# Patient Record
Sex: Male | Born: 1990 | Race: Black or African American | Hispanic: No | Marital: Single | Smoking: Never smoker
Health system: Southern US, Community
[De-identification: ages and names within clinical notes are randomized; demographics above are authoritative.]

## PROBLEM LIST (undated history)

## (undated) DIAGNOSIS — I10 Essential (primary) hypertension: Secondary | ICD-10-CM

## (undated) DIAGNOSIS — E119 Type 2 diabetes mellitus without complications: Secondary | ICD-10-CM

---
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
## 2014-04-01 ENCOUNTER — Emergency Department (HOSPITAL_COMMUNITY)
Admission: EM | Admit: 2014-04-01 | Discharge: 2014-04-02 | Disposition: A | Discharge status: Home / Self Care | Payer: Managed Care, Other (non HMO) | Attending: Emergency Medicine | Admitting: Emergency Medicine

## 2014-04-01 ENCOUNTER — Emergency Department (HOSPITAL_COMMUNITY): Payer: Managed Care, Other (non HMO)

## 2014-04-01 ENCOUNTER — Encounter (HOSPITAL_COMMUNITY): Payer: Self-pay | Admitting: Emergency Medicine

## 2014-04-01 DIAGNOSIS — R079 Chest pain, unspecified: Secondary | ICD-10-CM | POA: Diagnosis present

## 2014-04-01 DIAGNOSIS — J069 Acute upper respiratory infection, unspecified: Secondary | ICD-10-CM | POA: Diagnosis not present

## 2014-04-01 DIAGNOSIS — B9789 Other viral agents as the cause of diseases classified elsewhere: Secondary | ICD-10-CM

## 2014-04-01 LAB — BASIC METABOLIC PANEL
Anion gap: 14 (ref 5–15)
BUN: 10 mg/dL (ref 6–23)
CHLORIDE: 100 meq/L (ref 96–112)
CO2: 24 mEq/L (ref 19–32)
CREATININE: 1.05 mg/dL (ref 0.50–1.35)
Calcium: 8.7 mg/dL (ref 8.4–10.5)
GFR calc Af Amer: >90 mL/min (ref >90)
GFR calc non Af Amer: >90 mL/min (ref >90)
GLUCOSE: 93 mg/dL (ref 70–99)
Potassium: 3.9 mEq/L (ref 3.7–5.3)
Sodium: 138 mEq/L (ref 137–147)

## 2014-04-01 LAB — CBC
HEMATOCRIT: 42.8 % (ref 39.0–52.0)
HEMOGLOBIN: 14.8 g/dL (ref 13.0–17.0)
MCH: 31 pg (ref 26.0–34.0)
MCHC: 34.6 g/dL (ref 30.0–36.0)
MCV: 89.7 fL (ref 78.0–100.0)
Platelets: 209 10*3/uL (ref 150–400)
RBC: 4.77 MIL/uL (ref 4.22–5.81)
RDW: 11.9 % (ref 11.5–15.5)
WBC: 7.7 10*3/uL (ref 4.0–10.5)

## 2014-04-01 LAB — PRO B NATRIURETIC PEPTIDE: Pro B Natriuretic peptide (BNP): 39.9 pg/mL (ref 0–125)

## 2014-04-01 LAB — I-STAT TROPONIN, ED: TROPONIN I, POC: 0 ng/mL (ref 0.00–0.08)

## 2014-04-01 MED ORDER — ACETAMINOPHEN 325 MG PO TABS
650.0000 mg | ORAL_TABLET | Freq: Once | ORAL | Status: AC
  Administered 2014-04-01: 650 mg via ORAL
  Filled 2014-04-01: qty 2

## 2014-04-01 MED ORDER — IBUPROFEN 800 MG PO TABS
800.0000 mg | ORAL_TABLET | Freq: Once | ORAL | Status: AC
  Administered 2014-04-02: 800 mg via ORAL
  Filled 2014-04-01: qty 1

## 2014-04-01 NOTE — ED Notes (Signed)
Patient states he started with a cough 2 days ago and was just feeling bad.  States not hungry but has been taking fluids  Upon entering the room patient is sweating.  Denies any pain at this time

## 2014-04-01 NOTE — ED Provider Notes (Signed)
CSN: [id number]     Arrival date & time 04/01/14  2200 History   First MD Initiated Contact with Patient 04/01/14 2312     Chief Complaint  Patient presents with  . Chest Pain     (Consider location/radiation/quality/duration/timing/severity/associated sxs/prior Treatment) HPI  Tony Oneill is a 23 y.o. male with no significant past medical history coming in with chest pain. Patient states she's had viral URI like symptoms for the past 2 days. He describes this as rhinorrhea, subjective fevers, dry cough. He has had sick contacts and his friends with similar symptoms. He presents today because he had sudden onset of chest pain and dizziness. Pain is described as midsternal, nonradiating, and lasted approximately 30 minutes before self resolving in the waiting room. He is described as a throbbing sensation, nonexertional. He describes no shortness of breath, vomiting or diaphoresis with the pain.  Patient had normal appetite, he denies changes in his bowel or bladder. Patient is currently asymptomatic.  10 Systems reviewed and are negative for acute change except as noted in the HPI.    History reviewed. No pertinent past medical history. History reviewed. No pertinent past surgical history. No family history on file. History  Substance Use Topics  . Smoking status: Never Smoker   . Smokeless tobacco: Not on file  . Alcohol Use: No    Review of Systems    Allergies  Review of patient's allergies indicates no known allergies.  Home Medications   Prior to Admission medications   Medication Sig Start Date End Date Taking? Authorizing Provider  Acetaminophen-Guaifenesin (TYLENOL CHEST CONGESTION PO) Take 10 mLs by mouth daily as needed (cold).   Yes Historical Provider, MD  guaiFENesin (ROBITUSSIN) 100 MG/5ML liquid Take 200 mg by mouth 3 (three) times daily as needed for cough.   Yes Historical Provider, MD   BP 145/76  Pulse 92  Temp(Src) 100.4 F (38 C) (Oral)  Resp 21   Ht  (1.956 m)  Wt 310 lb (140.615 kg)  BMI 36.75 kg/m2  SpO2 97% Physical Exam  Nursing note and vitals reviewed. Constitutional: He is oriented to person, place, and time. Vital signs are normal. He appears well-developed and well-nourished.  Non-toxic appearance. He does not appear ill. No distress.  HENT:  Head: Normocephalic and atraumatic.  Nose: Nose normal.  Mouth/Throat: Oropharynx is clear and moist. No oropharyngeal exudate.  Eyes: Conjunctivae and EOM are normal. Pupils are equal, round, and reactive to light. No scleral icterus.  Neck: Normal range of motion. Neck supple. No tracheal deviation, no edema, no erythema and normal range of motion present. No mass and no thyromegaly present.  Cardiovascular: Normal rate, regular rhythm, S1 normal, S2 normal, normal heart sounds, intact distal pulses and normal pulses.  Exam reveals no gallop and no friction rub.   No murmur heard. Pulses:      Radial pulses are 2+ on the right side, and 2+ on the left side.       Dorsalis pedis pulses are 2+ on the right side, and 2+ on the left side.  No tachycardia my exam.  Pulmonary/Chest: Effort normal and breath sounds normal. No respiratory distress. He has no wheezes. He has no rhonchi. He has no rales.  Abdominal: Soft. Normal appearance and bowel sounds are normal. He exhibits no distension, no ascites and no mass. There is no hepatosplenomegaly. There is no tenderness. There is no rebound, no guarding and no CVA tenderness.  Musculoskeletal: Normal range of motion. He  exhibits no edema and no tenderness.  Lymphadenopathy:    He has no cervical adenopathy.  Neurological: He is alert and oriented to person, place, and time. He has normal strength. No cranial nerve deficit or sensory deficit. GCS eye subscore is 4. GCS verbal subscore is 5. GCS motor subscore is 6.  Skin: Skin is warm and intact. No petechiae and no rash noted. He is diaphoretic. No erythema. No pallor.  Psychiatric: He  has a normal mood and affect. His behavior is normal. Judgment normal.    ED Course  Procedures (including critical care time) Labs Review Labs Reviewed  CBC  BASIC METABOLIC PANEL  PRO B NATRIURETIC PEPTIDE  I-STAT TROPOININ, ED    Imaging Review Dg Chest 2 View  04/01/2014   CLINICAL DATA:  Chest pain  EXAM: CHEST  2 VIEW  COMPARISON:  None.  FINDINGS: Lungs are clear.  No pleural effusion or pneumothorax.  The heart is normal in size.  Visualized osseous structures are within normal limits.  IMPRESSION: Normal chest radiographs.   Electronically Signed   By: Charline Bills M.D.   On: 04/01/2014 23:03     EKG Interpretation   Date/Time:  Monday April 01 2014 22:03:41 EDT Ventricular Rate:  115 PR Interval:  150 QRS Duration: 74 QT Interval:  306 QTC Calculation: 423 R Axis:   73 Text Interpretation:  Sinus tachycardia T wave abnormality, consider  inferolateral ischemia Abnormal ECG Confirmed by Erroll Luna  [phone]) on 04/01/2014 11:11:46 PM      MDM   Final diagnoses:  None    Patient is to emergency department out of concern for chest pain. He states he took Robitussin prior to arrival. He is given Tylenol triaged which decreased his temperature from 102.3 down to 100.4. Patient's heart rate was in the room was 92, down from 1:15. His vital signs abnormalities are likely due to his fever. Laboratory studies are unremarkable, chest x-ray did not show a consolidation, My lung exam is clear bilaterally. I do not believe this patient has a pneumonia. This is likely a viral URI. He was given additional Motrin in the emergency department to further treat his fever. He is advised to continue taking Tylenol or Motrin at home for fever relief and return for any worsening. Patient is advised to followup as primary physician within 3 days.  Repeat EKG reveal tachycardia resolved.  TWI persist in v4-v6.  May be persistent juvenile t wave inversion pattern.  Suspicion  for ACS is low in setting of viral URI.  Non-exertional chest pain, without radiation, which resolved on its own in 30 min.  Tomasita Crumble, MD 04/02/14 [phone]

## 2014-04-01 NOTE — ED Notes (Signed)
Pt. reports midsternal chest pain onset this evening with SOB and occasional dry cough , denies nausea or diaphoresis . Febrile at triage .

## 2014-04-02 NOTE — Discharge Instructions (Signed)
Upper Respiratory Infection, Adult Tony Oneill, you were seen today for fever, cough, and runny nose.  Your chest xray did not show pneumonia and your lungs sound clear.  Please follow up with your regular doctor within 3 days for continued care.  Continue to take tylenol or motrin at home for fever.  If your symptoms worsen, or you start coughing stuff up, having persistent fevers and worsening shortness of breath, return to the ED immediately for repeat evaluation.  Thank you. An upper respiratory infection (URI) is also known as the common cold. It is often caused by a type of germ (virus). Colds are easily spread (contagious). You can pass it to others by kissing, coughing, sneezing, or drinking out of the same glass. Usually, you get better in 1 or 2 weeks.  HOME CARE   Only take medicine as told by your doctor.  Use a warm mist humidifier or breathe in steam from a hot shower.  Drink enough water and fluids to keep your pee (urine) clear or pale yellow.  Get plenty of rest.  Return to work when your temperature is back to normal or as told by your doctor. You may use a face mask and wash your hands to stop your cold from spreading. GET HELP RIGHT AWAY IF:   After the first few days, you feel you are getting worse.  You have questions about your medicine.  You have chills, shortness of breath, or brown or red spit (mucus).  You have yellow or brown snot (nasal discharge) or pain in the face, especially when you bend forward.  You have a fever, puffy (swollen) neck, pain when you swallow, or white spots in the back of your throat.  You have a bad headache, ear pain, sinus pain, or chest pain.  You have a high-pitched whistling sound when you breathe in and out (wheezing).  You have a lasting cough or cough up blood.  You have sore muscles or a stiff neck. MAKE SURE YOU:   Understand these instructions.  Will watch your condition.  Will get help right away if you are not doing  well or get worse. Document Released: 12/29/2007 Document Revised: 10/04/2011 Document Reviewed: 10/17/2013 [facility] Patient Information 2015 [place], [place]. This information is not intended to replace advice given to you by your health care provider. Make sure you discuss any questions you have with your health care provider.

## 2014-12-30 ENCOUNTER — Encounter (HOSPITAL_COMMUNITY): Payer: Self-pay | Admitting: Emergency Medicine

## 2014-12-30 ENCOUNTER — Emergency Department (HOSPITAL_COMMUNITY)
Admission: EM | Admit: 2014-12-30 | Discharge: 2014-12-30 | Disposition: A | Discharge status: Home / Self Care | Payer: Managed Care, Other (non HMO) | Attending: Emergency Medicine | Admitting: Emergency Medicine

## 2014-12-30 DIAGNOSIS — R1084 Generalized abdominal pain: Secondary | ICD-10-CM

## 2014-12-30 DIAGNOSIS — R197 Diarrhea, unspecified: Secondary | ICD-10-CM

## 2014-12-30 DIAGNOSIS — R509 Fever, unspecified: Secondary | ICD-10-CM | POA: Insufficient documentation

## 2014-12-30 DIAGNOSIS — E860 Dehydration: Secondary | ICD-10-CM | POA: Insufficient documentation

## 2014-12-30 DIAGNOSIS — R630 Anorexia: Secondary | ICD-10-CM | POA: Insufficient documentation

## 2014-12-30 DIAGNOSIS — R000 Tachycardia, unspecified: Secondary | ICD-10-CM | POA: Insufficient documentation

## 2014-12-30 DIAGNOSIS — R110 Nausea: Secondary | ICD-10-CM

## 2014-12-30 DIAGNOSIS — E669 Obesity, unspecified: Secondary | ICD-10-CM | POA: Insufficient documentation

## 2014-12-30 LAB — URINALYSIS, ROUTINE W REFLEX MICROSCOPIC
Bilirubin Urine: NEGATIVE
GLUCOSE, UA: NEGATIVE mg/dL
HGB URINE DIPSTICK: NEGATIVE
Ketones, ur: 15 mg/dL — AB
Nitrite: NEGATIVE
Protein, ur: 30 mg/dL — AB
SPECIFIC GRAVITY, URINE: 1.035 — AB (ref 1.005–1.030)
UROBILINOGEN UA: 1 mg/dL (ref 0.0–1.0)
pH: 5.5 (ref 5.0–8.0)

## 2014-12-30 LAB — CBC WITH DIFFERENTIAL/PLATELET
Basophils Absolute: 0 10*3/uL (ref 0.0–0.1)
Basophils Relative: 0 % (ref 0–1)
EOS PCT: 0 % (ref 0–5)
Eosinophils Absolute: 0 10*3/uL (ref 0.0–0.7)
HCT: 41.6 % (ref 39.0–52.0)
HEMOGLOBIN: 13.9 g/dL (ref 13.0–17.0)
LYMPHS ABS: 1.2 10*3/uL (ref 0.7–4.0)
LYMPHS PCT: 13 % (ref 12–46)
MCH: 30.3 pg (ref 26.0–34.0)
MCHC: 33.4 g/dL (ref 30.0–36.0)
MCV: 90.6 fL (ref 78.0–100.0)
MONOS PCT: 15 % — AB (ref 3–12)
Monocytes Absolute: 1.4 10*3/uL — ABNORMAL HIGH (ref 0.1–1.0)
Neutro Abs: 6.9 10*3/uL (ref 1.7–7.7)
Neutrophils Relative %: 72 % (ref 43–77)
Platelets: 153 10*3/uL (ref 150–400)
RBC: 4.59 MIL/uL (ref 4.22–5.81)
RDW: 12.2 % (ref 11.5–15.5)
WBC: 9.6 10*3/uL (ref 4.0–10.5)

## 2014-12-30 LAB — COMPREHENSIVE METABOLIC PANEL
ALT: 33 U/L (ref 17–63)
AST: 36 U/L (ref 15–41)
Albumin: 3.5 g/dL (ref 3.5–5.0)
Alkaline Phosphatase: 49 U/L (ref 38–126)
Anion gap: 10 (ref 5–15)
BUN: 13 mg/dL (ref 6–20)
CO2: 26 mmol/L (ref 22–32)
Calcium: 8.6 mg/dL — ABNORMAL LOW (ref 8.9–10.3)
Chloride: 101 mmol/L (ref 101–111)
Creatinine, Ser: 1.28 mg/dL — ABNORMAL HIGH (ref 0.61–1.24)
GFR calc Af Amer: >60 mL/min (ref >60)
GFR calc non Af Amer: >60 mL/min (ref >60)
Glucose, Bld: 88 mg/dL (ref 65–99)
Potassium: 3.7 mmol/L (ref 3.5–5.1)
Sodium: 137 mmol/L (ref 135–145)
Total Bilirubin: 0.9 mg/dL (ref 0.3–1.2)
Total Protein: 8.2 g/dL — ABNORMAL HIGH (ref 6.5–8.1)

## 2014-12-30 LAB — URINE MICROSCOPIC-ADD ON

## 2014-12-30 LAB — I-STAT CHEM 8, ED
BUN: 12 mg/dL (ref 6–20)
CALCIUM ION: 1.13 mmol/L (ref 1.12–1.23)
CHLORIDE: 100 mmol/L — AB (ref 101–111)
CREATININE: 1.3 mg/dL — AB (ref 0.61–1.24)
Glucose, Bld: 105 mg/dL — ABNORMAL HIGH (ref 65–99)
HCT: 45 % (ref 39.0–52.0)
HEMOGLOBIN: 15.3 g/dL (ref 13.0–17.0)
POTASSIUM: 3.8 mmol/L (ref 3.5–5.1)
SODIUM: 138 mmol/L (ref 135–145)
TCO2: 23 mmol/L (ref 0–100)

## 2014-12-30 MED ORDER — PROMETHAZINE HCL 25 MG PO TABS: 25.0000 mg | ORAL_TABLET | Freq: Four times a day (QID) | ORAL | Status: AC | PRN

## 2014-12-30 MED ORDER — SODIUM CHLORIDE 0.9 % IV BOLUS (SEPSIS)
1000.0000 mL | Freq: Once | INTRAVENOUS | Status: AC
  Administered 2014-12-30: 1000 mL via INTRAVENOUS

## 2014-12-30 MED ORDER — KETOROLAC TROMETHAMINE 30 MG/ML IJ SOLN
30.0000 mg | Freq: Once | INTRAMUSCULAR | Status: AC
  Administered 2014-12-30: 30 mg via INTRAVENOUS
  Filled 2014-12-30: qty 1

## 2014-12-30 MED ORDER — ONDANSETRON HCL 4 MG/2ML IJ SOLN
4.0000 mg | Freq: Once | INTRAMUSCULAR | Status: AC
  Administered 2014-12-30: 4 mg via INTRAVENOUS
  Filled 2014-12-30: qty 2

## 2014-12-30 MED ORDER — LOPERAMIDE HCL 2 MG PO CAPS
4.0000 mg | ORAL_CAPSULE | Freq: Once | ORAL | Status: AC
  Administered 2014-12-30: 4 mg via ORAL
  Filled 2014-12-30: qty 2

## 2014-12-30 MED ORDER — ACETAMINOPHEN 325 MG PO TABS
650.0000 mg | ORAL_TABLET | Freq: Once | ORAL | Status: AC
  Administered 2014-12-30: 650 mg via ORAL
  Filled 2014-12-30: qty 2

## 2014-12-30 NOTE — ED Notes (Signed)
Ambulated pt down hallway. Oxygen saturation remained 100% HR-90-95 resp 21

## 2014-12-30 NOTE — ED Notes (Signed)
Pt c/o abd pain with nausea, diarrhea and elevated temp x's 3 days

## 2014-12-30 NOTE — ED Notes (Signed)
Pt is aware urine is needed for testing, urinal at bedside. 

## 2014-12-30 NOTE — ED Provider Notes (Signed)
CSN: [id number]     Arrival date & time 12/30/14  1659 History   First MD Initiated Contact with Patient 12/30/14 2009     Chief Complaint  Patient presents with  . Abdominal Pain     (Consider location/radiation/quality/duration/timing/severity/associated sxs/prior Treatment) HPI Pt is a 24yo obese male, presenting to ED with c/o generalized cramping abdominal pain with associated nausea and watery diarrhea that started yesterday after he ate come [employer].  Pt states he thinks that is what caused his symptoms. Triage note states pt has had symptoms for 3 days but pt states it started yesterday. Pt also reports subjective fever with hot and cold chills.  Reports nausea but no vomiting. He has had about 20 episodes of watery diarrhea today w/o blood or mucous. Abdominal pain is cramping, 7/10 at worst. Denies back pain, denies pain with urination or decreased urine but does report darkened urine.  No sick contacts or recent travel. No recent use of antibiotics. Denies hx of abdominal surgeries. No other significant PMH.    History reviewed. No pertinent past medical history. History reviewed. No pertinent past surgical history. No family history on file. History  Substance Use Topics  . Smoking status: Never Smoker   . Smokeless tobacco: Not on file  . Alcohol Use: No    Review of Systems  Constitutional: Positive for fever and appetite change. Negative for chills, diaphoresis and fatigue.  Respiratory: Negative for cough and shortness of breath.   Cardiovascular: Negative for chest pain, palpitations and leg swelling.  Gastrointestinal: Positive for nausea, abdominal pain ( generalized, cramping) and diarrhea. Negative for vomiting, constipation, blood in stool and rectal pain.  Genitourinary: Positive for hematuria. Negative for dysuria, urgency, frequency, decreased urine volume, discharge, penile swelling, scrotal swelling, penile pain and testicular pain.  Musculoskeletal: Negative for  myalgias and back pain.  Skin: Negative for rash.  All other systems reviewed and are negative.     Allergies  Review of patient's allergies indicates no known allergies.  Home Medications   Prior to Admission medications   Not on File   BP 116/55 mmHg  Pulse 81  Temp(Src) 101 F (38.3 C) (Oral)  Resp 19  SpO2 97% Physical Exam  Constitutional: He appears well-developed and well-nourished.  Obese male lying in exam bed, NAD  HENT:  Head: Normocephalic and atraumatic.  Mouth/Throat: Oropharynx is clear and moist.  Eyes: Conjunctivae are normal. No scleral icterus.  Neck: Normal range of motion. Neck supple.  Cardiovascular: Regular rhythm and normal heart sounds.  Tachycardia present.   Pulmonary/Chest: Effort normal and breath sounds normal. No respiratory distress. He has no wheezes. He has no rales. He exhibits no tenderness.  Abdominal: Soft. Bowel sounds are normal. He exhibits no distension and no mass. There is no tenderness. There is no rebound and no guarding.  Obese abdomen, soft, non-tender.  Musculoskeletal: Normal range of motion.  Neurological: He is alert.  Skin: Skin is warm and dry.  Nursing note and vitals reviewed.   ED Course  Procedures (including critical care time) Labs Review Labs Reviewed  CBC WITH DIFFERENTIAL/PLATELET - Abnormal; Notable for the following:    Monocytes Relative 15 (*)    Monocytes Absolute 1.4 (*)    All other components within normal limits  URINALYSIS, ROUTINE W REFLEX MICROSCOPIC (NOT AT [facility]) - Abnormal; Notable for the following:    Color, Urine ORANGE (*)    Specific Gravity, Urine 1.035 (*)    Ketones, ur 15 (*)  Protein, ur 30 (*)    Leukocytes, UA TRACE (*)    All other components within normal limits  COMPREHENSIVE METABOLIC PANEL - Abnormal; Notable for the following:    Creatinine, Ser 1.28 (*)    Calcium 8.6 (*)    Total Protein 8.2 (*)    All other components within normal limits  URINE  MICROSCOPIC-ADD ON - Abnormal; Notable for the following:    Casts HYALINE CASTS (*)    All other components within normal limits  I-STAT CHEM 8, ED - Abnormal; Notable for the following:    Chloride 100 (*)    Creatinine, Ser 1.30 (*)    Glucose, Bld 105 (*)    All other components within normal limits  STOOL CULTURE    Imaging Review No results found.   EKG Interpretation None      MDM   Final diagnoses:  None   Pt is a 24yo male presenting to ED with c/o 2 day hx of nausea with diarrhea and generalized abdominal pain. No sick contacts, recent travel, or recent antibiotic use.  Pt is obese, abdomen is soft, non-tender. Moist mucous membranes.  Vitals: pt tachycardic, temp of 103. Pt given acetaminophen in triage. Labs c/w mild dehydration with slight elevated Cr at 1.3 and UA positive for ketones and protein.   Doubt acute surgical abdomen, no tenderness on exam. No leukocytosis.  Symptoms likely viral in nature or related to [facility] pt ate yesterday prior to symptom onset.  Order for stool culture placed. Pt given 2L IV fluids, toradol, imodium, and zofran.  Pt states he is feeling better after IV fluids, pain resolved.  Vitals had improved, however, at discharge, Resp rate went up to 28.  Discussed pt with Dr. Fredderick Phenix, will ambulate pt in halls.   Per Donneta Romberg, RN, pt did well ambulating with O2 Sat of 100% on RA, HR:90-95bpm, resp 21.  Pt safe for discharge home. Home care instructions and work note provided. Rx: phenergan. Advised to f/u with PCP at [facility] in 2-3 days for symptom recheck if not improving. Return precautions provided. Pt and family verbalized understanding and agreement with tx plan.   Junius Finner, PA-C 12/30/14 [id number]  Rolan Bucco, MD 12/30/14 [phone]

## 2014-12-30 NOTE — ED Notes (Signed)
PA AT BEDSIDE  

## 2015-01-07 DIAGNOSIS — R531 Weakness: Secondary | ICD-10-CM | POA: Insufficient documentation

## 2015-01-07 NOTE — ED Notes (Signed)
Pt in c/o continued dizziness since he was sick last week, states he thought he had food poisoning, other symptoms have resolved but patient still feels dizzy at times if he changes position, seen here for same already, no distress noted

## 2015-01-08 ENCOUNTER — Emergency Department (HOSPITAL_COMMUNITY): Payer: Managed Care, Other (non HMO)

## 2015-01-08 ENCOUNTER — Emergency Department (HOSPITAL_COMMUNITY)
Admission: EM | Admit: 2015-01-08 | Discharge: 2015-01-08 | Disposition: A | Discharge status: Home / Self Care | Payer: Self-pay | Attending: Emergency Medicine | Admitting: Emergency Medicine

## 2015-01-08 DIAGNOSIS — R531 Weakness: Secondary | ICD-10-CM

## 2015-01-08 LAB — HEPATIC FUNCTION PANEL
ALBUMIN: 3.7 g/dL (ref 3.5–5.0)
ALT: 84 U/L — ABNORMAL HIGH (ref 17–63)
AST: 52 U/L — ABNORMAL HIGH (ref 15–41)
Alkaline Phosphatase: 48 U/L (ref 38–126)
Bilirubin, Direct: 0.2 mg/dL (ref 0.1–0.5)
Indirect Bilirubin: 0.3 mg/dL (ref 0.3–0.9)
Total Bilirubin: 0.5 mg/dL (ref 0.3–1.2)
Total Protein: 9.3 g/dL — ABNORMAL HIGH (ref 6.5–8.1)

## 2015-01-08 LAB — CBC
HCT: 41.4 % (ref 39.0–52.0)
Hemoglobin: 13.9 g/dL (ref 13.0–17.0)
MCH: 30 pg (ref 26.0–34.0)
MCHC: 33.6 g/dL (ref 30.0–36.0)
MCV: 89.2 fL (ref 78.0–100.0)
PLATELETS: 389 10*3/uL (ref 150–400)
RBC: 4.64 MIL/uL (ref 4.22–5.81)
RDW: 11.7 % (ref 11.5–15.5)
WBC: 7.8 10*3/uL (ref 4.0–10.5)

## 2015-01-08 LAB — BASIC METABOLIC PANEL
Anion gap: 10 (ref 5–15)
BUN: 9 mg/dL (ref 6–20)
CO2: 24 mmol/L (ref 22–32)
Calcium: 9.6 mg/dL (ref 8.9–10.3)
Chloride: 108 mmol/L (ref 101–111)
Creatinine, Ser: 1.05 mg/dL (ref 0.61–1.24)
GFR calc Af Amer: >60 mL/min (ref >60)
Glucose, Bld: 91 mg/dL (ref 65–99)
Potassium: 3.8 mmol/L (ref 3.5–5.1)
SODIUM: 142 mmol/L (ref 135–145)

## 2015-01-08 LAB — I-STAT TROPONIN, ED: Troponin i, poc: 0 ng/mL (ref 0.00–0.08)

## 2015-01-08 MED ORDER — SODIUM CHLORIDE 0.9 % IV BOLUS (SEPSIS)
1000.0000 mL | Freq: Once | INTRAVENOUS | Status: AC
  Administered 2015-01-08: 1000 mL via INTRAVENOUS

## 2015-01-08 NOTE — ED Notes (Signed)
Pt stable, ambulatory, states understanding of discharge instructions 

## 2015-01-08 NOTE — ED Provider Notes (Signed)
CSN: [id number]     Arrival date & time 01/07/15  2320 History  This chart was scribed for Tomasita Crumble, MD by Bronson Curb, ED Scribe. This patient was seen in room B17C/B17C and the patient's care was started at 3:41 AM.   Chief Complaint  Patient presents with  . Dizziness    The history is provided by the patient. No language interpreter was used.     HPI Comments: Tony Oneill is a 24 y.o. male who presents to the Emergency Department complaining of gradual onset, intermittent, dizziness for the past 9 days. Patient was seen here 9 days ago for abdominal pain and diarrhea that believed was related to food poisoning. However, he reports his symptoms have since resolved, but still feels dizzy at this time. He is unsure of what triggers his dizziness and states episodes occur at random. There is associated generalized weakness/fatigue, nausea, and decreased appetite. He denies any other symptoms at this time.    No past medical history on file. No past surgical history on file. No family history on file. History  Substance Use Topics  . Smoking status: Never Smoker   . Smokeless tobacco: Not on file  . Alcohol Use: No    Review of Systems  A complete 10 system review of systems was obtained and all systems are negative except as noted in the HPI and PMH.   Allergies  Review of patient's allergies indicates no known allergies.  Home Medications   Prior to Admission medications   Medication Sig Start Date End Date Taking? Authorizing Provider  acetaminophen (TYLENOL) 325 MG tablet Take 650 mg by mouth every 6 (six) hours as needed for mild pain.   Yes Historical Provider, MD  promethazine (PHENERGAN) 25 MG tablet Take 1 tablet (25 mg total) by mouth every 6 (six) hours as needed for nausea or vomiting. Patient not taking: Reported on 01/08/2015 12/30/14   Junius Finner, PA-C   Triage Vitals: BP 144/90 mmHg  Pulse 78  Temp(Src) 99.3 F (37.4 C)  Resp 20  Wt 295 lb  (133.811 kg)  SpO2 98%  Physical Exam  Constitutional: He is oriented to person, place, and time. Vital signs are normal. He appears well-developed and well-nourished.  Non-toxic appearance. He does not appear ill. No distress.  HENT:  Head: Normocephalic and atraumatic.  Nose: Nose normal.  Mouth/Throat: Oropharynx is clear and moist. No oropharyngeal exudate.  Eyes: Conjunctivae and EOM are normal. Pupils are equal, round, and reactive to light. No scleral icterus.  Neck: Normal range of motion. Neck supple. No tracheal deviation, no edema, no erythema and normal range of motion present. No thyroid mass and no thyromegaly present.  Cardiovascular: Normal rate, regular rhythm, S1 normal, S2 normal, normal heart sounds, intact distal pulses and normal pulses.  Exam reveals no gallop and no friction rub.   No murmur heard. Pulses:      Radial pulses are 2+ on the right side, and 2+ on the left side.       Dorsalis pedis pulses are 2+ on the right side, and 2+ on the left side.  Pulmonary/Chest: Effort normal and breath sounds normal. No respiratory distress. He has no wheezes. He has no rhonchi. He has no rales.  Abdominal: Soft. Normal appearance and bowel sounds are normal. He exhibits no distension, no ascites and no mass. There is no hepatosplenomegaly. There is no tenderness. There is no rebound, no guarding and no CVA tenderness.  Musculoskeletal: Normal range of  motion. He exhibits no edema or tenderness.  Lymphadenopathy:    He has no cervical adenopathy.  Neurological: He is alert and oriented to person, place, and time. He has normal strength. No cranial nerve deficit or sensory deficit.  Skin: Skin is warm, dry and intact. No petechiae and no rash noted. He is not diaphoretic. No erythema. No pallor.  Psychiatric: He has a normal mood and affect. His behavior is normal. Judgment normal.  Nursing note and vitals reviewed.   ED Course  Procedures (including critical care  time)  DIAGNOSTIC STUDIES: Oxygen Saturation is 98% on room air, normal by my interpretation.    COORDINATION OF CARE: At 78 Discussed treatment plan with patient which includes CXR. Patient agrees.   Labs Review Labs Reviewed  HEPATIC FUNCTION PANEL - Abnormal; Notable for the following:    Total Protein 9.3 (*)    AST 52 (*)    ALT 84 (*)    All other components within normal limits  CBC  BASIC METABOLIC PANEL  Rosezena Sensor, ED    Imaging Review Dg Chest 2 View  01/08/2015   CLINICAL DATA:  Dizziness.  Food poisoning over the weekend.  EXAM: CHEST  2 VIEW  COMPARISON:  04/01/2014  FINDINGS: The heart size and mediastinal contours are within normal limits. Both lungs are clear. The visualized skeletal structures are unremarkable.  IMPRESSION: No active cardiopulmonary disease.   Electronically Signed   By: Burman Nieves M.D.   On: 01/08/2015 04:17     EKG Interpretation   Date/Time:  Wednesday January 08 2015 04:00:45 EDT Ventricular Rate:  72 PR Interval:  181 QRS Duration: 80 QT Interval:  392 QTC Calculation: 429 R Axis:   53 Text Interpretation:  Sinus rhythm new TWI in V3 Confirmed by Erroll Luna [id number]) on 01/08/2015 4:04:58 AM      MDM   Final diagnoses:  None  Patient presents to the emergency department for dizziness and weakness. He was evaluated last week and thought he had food poisoning but his dizziness persists.  His dizziness is intermittent and gradual in onset. He states he's had a poor appetite. He feels dehydrated. I give the patient 1 L fluid bolus and he states "I feel a lot better". EKG does show single new T wave in V3, troponin is negative. His history is not consistent with a cardiac etiology. He appears well in no acute distress. His vital signs were within his normal limits and is safe for discharge with close primary care follow-up within 3 days.   I personally performed the services described in this documentation, which  was scribed in my presence. The recorded information has been reviewed and is accurate.   Tomasita Crumble, MD 01/08/15 [phone]

## 2015-01-08 NOTE — Discharge Instructions (Signed)
Dizziness Tony Oneill, see a primary care physician within 3 days for close follow-up. If any symptoms worsen come back to the emergency department immediately. Thank you.  Dizziness means you feel unsteady or lightheaded. You might feel like you are going to pass out (faint). HOME CARE   Drink enough fluids to keep your pee (urine) clear or pale yellow.  Take your medicines exactly as told by your doctor. If you take blood pressure medicine, always stand up slowly from the lying or sitting position. Hold on to something to steady yourself.  If you need to stand in one place for a long time, move your legs often. Tighten and relax your leg muscles.  Have someone stay with you until you feel okay.  Do not drive or use heavy machinery if you feel dizzy.  Do not drink alcohol. GET HELP RIGHT AWAY IF:   You feel dizzy or lightheaded and it gets worse.  You feel sick to your stomach (nauseous), or you throw up (vomit).  You have trouble talking or walking.  You feel weak or have trouble using your arms, hands, or legs.  You cannot think clearly or have trouble forming sentences.  You have chest pain, belly (abdominal) pain, sweating, or you are short of breath.  Your vision changes.  You are bleeding.  You have problems from your medicine that seem to be getting worse. MAKE SURE YOU:   Understand these instructions.  Will watch your condition.  Will get help right away if you are not doing well or get worse. Document Released: 07/01/2011 Document Revised: 10/04/2011 Document Reviewed: 07/01/2011 [facility] Patient Information 2015 [place], [place]. This information is not intended to replace advice given to you by your health care provider. Make sure you discuss any questions you have with your health care provider. Weakness Weakness is a lack of strength. You may feel weak all over your body or just in one part of your body. Weakness can be serious. In some cases, you may need  more medical tests. HOME CARE  Rest.  Eat a well-balanced diet.  Try to exercise every day.  Only take medicines as told by your doctor. GET HELP RIGHT AWAY IF:   You cannot do your normal daily activities.  You cannot walk up and down stairs, or you feel very tired when you do so.  You have shortness of breath or chest pain.  You have trouble moving parts of your body.  You have weakness in only one body part or on only one side of the body.  You have a fever.  You have trouble speaking or swallowing.  You cannot control when you pee (urinate) or poop (bowel movement).  You have black or bloody throw up (vomit) or poop.  Your weakness gets worse or spreads to other body parts.  You have new aches or pains. MAKE SURE YOU:   Understand these instructions.  Will watch your condition.  Will get help right away if you are not doing well or get worse. Document Released: 06/24/2008 Document Revised: 01/11/2012 Document Reviewed: 09/10/2011 [facility] Patient Information 2015 [place], [place]. This information is not intended to replace advice given to you by your health care provider. Make sure you discuss any questions you have with your health care provider.

## 2015-11-10 ENCOUNTER — Ambulatory Visit (INDEPENDENT_AMBULATORY_CARE_PROVIDER_SITE_OTHER): Payer: Managed Care, Other (non HMO) | Admitting: Physician Assistant

## 2015-11-10 VITALS — BP 135/85 | HR 71 | Temp 99.4°F | Resp 18 | Ht 77.0 in | Wt 275.0 lb

## 2015-11-10 DIAGNOSIS — S46811A Strain of other muscles, fascia and tendons at shoulder and upper arm level, right arm, initial encounter: Secondary | ICD-10-CM | POA: Diagnosis not present

## 2015-11-10 MED ORDER — CYCLOBENZAPRINE HCL 5 MG PO TABS: 5.0000 mg | ORAL_TABLET | Freq: Three times a day (TID) | ORAL | Status: AC | PRN

## 2015-11-10 MED ORDER — MELOXICAM 7.5 MG PO TABS: 7.5000 mg | ORAL_TABLET | Freq: Every day | ORAL | Status: AC

## 2015-11-10 NOTE — Progress Notes (Signed)
[facility] [address] [place] [address] [id number] [phone]  Date:  11/10/2015   Name:  Tony Oneill   DOB:  January 17, 1991   MRN:  [id number]  PCP:  No PCP Per Patient   History of Present Illness:  Tony Oneill is a 25 y.o. male patient who presents to [facility] for cc o f shoulder pain. He has had shoulder pain since yesterday evening.  He has no radiating pain down the arm.  Hurts to raise the arm or reach.  He was lifting a 60lb box at work, and within hours, he developed shoulder pain.  He has no swelling.  No redness.  He has not done anything for pain.      There are no active problems to display for this patient.   No past medical history on file.  No past surgical history on file.  Social History  Substance Use Topics  . Smoking status: Never Smoker   . Smokeless tobacco: Not on file  . Alcohol Use: No    No family history on file.  No Known Allergies  Medication list has been reviewed and updated.  Current Outpatient Prescriptions on File Prior to Visit  Medication Sig Dispense Refill  . acetaminophen (TYLENOL) 325 MG tablet Take 650 mg by mouth every 6 (six) hours as needed for mild pain.    . promethazine (PHENERGAN) 25 MG tablet Take 1 tablet (25 mg total) by mouth every 6 (six) hours as needed for nausea or vomiting. (Patient not taking: Reported on 01/08/2015) 10 tablet 0   No current facility-administered medications on file prior to visit.    ROS ROS otherwise unremarkable unless listed above.   Physical Examination: BP 135/85 mmHg  Pulse 71  Temp(Src) 99.4 F (37.4 C) (Oral)  Resp 18  Ht  (1.956 m)  Wt 275 lb (124.739 kg)  BMI 32.60 kg/m2  SpO2 98% Ideal Body Weight: Weight in (lb) to have BMI = 25: 210.4  Physical Exam  Constitutional: He is oriented to person, place, and time. He appears well-developed and well-nourished. No distress.  HENT:  Head: Normocephalic and atraumatic.  Eyes: Conjunctivae and EOM are  normal. Pupils are equal, round, and reactive to light.  Cardiovascular: Normal rate.   Pulmonary/Chest: Effort normal. No respiratory distress. He has no wheezes.  Musculoskeletal:       Right shoulder: He exhibits decreased range of motion (internal rotation) and tenderness (tender along the anterior trapezius.  ). He exhibits no bony tenderness and no swelling.       Cervical back: He exhibits decreased range of motion (lateral deviation to the right mildly diminished.). He exhibits no bony tenderness.  Lymphadenopathy:    He has no cervical adenopathy.  Neurological: He is alert and oriented to person, place, and time.  Skin: Skin is warm and dry. He is not diaphoretic.  Psychiatric: He has a normal mood and affect. His behavior is normal.   Assessment and Plan: Tony Oneill is a 25 y.o. male who is here today for shoulder pain. This appears muscular by hx and exam.  Given anti-inflammatory.  Discussed precautions.  Also given muscle-relaxant.  Advised stretches.  Advised return if symptoms worsen. 1. Trapezius muscle strain, right, initial encounter - meloxicam (MOBIC) 7.5 MG tablet; Take 1 tablet (7.5 mg total) by mouth daily.  Dispense: 14 tablet; Refill: 0 - cyclobenzaprine (FLEXERIL) 5 MG tablet; Take 1-2 tablets (5-10 mg total) by mouth 3 (three)  times daily as needed for muscle spasms.  Dispense: 30 tablet; Refill: 0   Trena PlattStephanie Meloney Feld, PA-C [facility] 11/10/2015 3:52 PM

## 2015-11-10 NOTE — Patient Instructions (Addendum)
IF you received an x-ray today, you will receive an invoice from [facility]. Please contact [facility] at [phone] with questions or concerns regarding your invoice.   IF you received labwork today, you will receive an invoice from [employer]. Please contact [facility] at [phone] with questions or concerns regarding your invoice.   Our billing staff will not be able to assist you with questions regarding bills from these companies.  You will be contacted with the lab results as soon as they are available. The fastest way to get your results is to activate your My Chart account. Instructions are located on the last page of this paperwork. If you have not heard from [place] regarding the results in 2 weeks, please contact this office.    Please heat the area prior to stretches.  You will perform stretches 3 times per day.  You will then ice the area directly after all stretches.   Please try 1 tablet of the flexeril.  If this is fine, you may try 2 at night.  Please do not use during work as this may be too sedating for you. I would like you to take the mobic daily.  Do not take ibuprofen or naproxen while taking this drug.  You may take tylenol if necessary.   Shoulder Range of Motion Exercises Shoulder range of motion (ROM) exercises are designed to keep the shoulder moving freely. They are often recommended for people who have shoulder pain. MOVEMENT EXERCISE When you are able, do this exercise 5-6 days per week, or as told by your health care provider. Work toward doing 2 sets of 10 swings. Pendulum Exercise How To Do This Exercise Lying Down 1. Lie face-down on a bed with your abdomen close to the side of the bed. 2. Let your arm hang over the side of the bed. 3. Relax your shoulder, arm, and hand. 4. Slowly and gently swing your arm forward and back. Do not use your neck muscles to swing your arm. They should be relaxed. If you are  struggling to swing your arm, have someone gently swing it for you. When you do this exercise for the first time, swing your arm at a 15 degree angle for 15 seconds, or swing your arm 10 times. As pain lessens over time, increase the angle of the swing to 30-45 degrees. 5. Repeat steps 1-4 with the other arm. How To Do This Exercise While Standing 1. Stand next to a sturdy chair or table and hold on to it with your hand.  Bend forward at the waist.  Bend your knees slightly.  Relax your other arm and let it hang limp.  Relax the shoulder blade of the arm that is hanging and let it drop.  While keeping your shoulder relaxed, use body motion to swing your arm in small circles. The first time you do this exercise, swing your arm for about 30 seconds or 10 times. When you do it next time, swing your arm for a little longer.  Stand up tall and relax.  Repeat steps 1-7, this time changing the direction of the circles. 2. Repeat steps 1-8 with the other arm. STRETCHING EXERCISES Do these exercises 3-4 times per day on 5-6 days per week or as told by your health care provider. Work toward holding the stretch for 20 seconds. Stretching Exercise 1 1. Lift your arm straight out in front of you. 2. Bend your arm 90 degrees at the elbow (right  angle) so your forearm goes across your body and looks like the letter "L." 3. Use your other arm to gently pull the elbow forward and across your body. 4. Repeat steps 1-3 with the other arm. Stretching Exercise 2 You will need a towel or rope for this exercise. 1. Bend one arm behind your back with the palm facing outward. 2. Hold a towel with your other hand. 3. Reach the arm that holds the towel above your head, and bend that arm at the elbow. Your wrist should be behind your neck. 4. Use your free hand to grab the free end of the towel. 5. With the higher hand, gently pull the towel up behind you. 6. With the lower hand, pull the towel down behind  you. 7. Repeat steps 1-6 with the other arm. STRENGTHENING EXERCISES Do each of these exercises at four different times of day (sessions) every day or as told by your health care provider. To begin with, repeat each exercise 5 times (repetitions). Work toward doing 3 sets of 12 repetitions or as told by your health care provider. Strengthening Exercise 1 You will need a light weight for this activity. As you grow stronger, you may use a heavier weight. 1. Standing with a weight in your hand, lift your arm straight out to the side until it is at the same height as your shoulder. 2. Bend your arm at 90 degrees so that your fingers are pointing to the ceiling. 3. Slowly raise your hand until your arm is straight up in the air. 4. Repeat steps 1-3 with the other arm. Strengthening Exercise 2 You will need a light weight for this activity. As you grow stronger, you may use a heavier weight. 1. Standing with a weight in your hand, gradually move your straight arm in an arc, starting at your side, then out in front of you, then straight up over your head. 2. Gradually move your other arm in an arc, starting at your side, then out in front of you, then straight up over your head. 3. Repeat steps 1-2 with the other arm. Strengthening Exercise 3 You will need an elastic band for this activity. As you grow stronger, gradually increase the size of the bands or increase the number of bands that you use at one time. 1. While standing, hold an elastic band in one hand and raise that arm up in the air. 2. With your other hand, pull down the band until that hand is by your side. 3. Repeat steps 1-2 with the other arm.   This information is not intended to replace advice given to you by your health care provider. Make sure you discuss any questions you have with your health care provider.   Document Released: 04/10/2003 Document Revised: 11/26/2014 Document Reviewed: 07/08/2014 Elsevier Interactive Patient  Education [employer]2016 Elsevier Inc.

## 2015-12-05 ENCOUNTER — Encounter: Payer: Self-pay | Admitting: Physician Assistant

## 2016-11-15 ENCOUNTER — Ambulatory Visit (INDEPENDENT_AMBULATORY_CARE_PROVIDER_SITE_OTHER): Payer: Managed Care, Other (non HMO) | Admitting: Physician Assistant

## 2016-11-15 VITALS — BP 128/79 | HR 70 | Temp 98.4°F | Resp 16 | Ht 77.0 in | Wt 281.6 lb

## 2016-11-15 DIAGNOSIS — R509 Fever, unspecified: Secondary | ICD-10-CM | POA: Diagnosis not present

## 2016-11-15 DIAGNOSIS — J101 Influenza due to other identified influenza virus with other respiratory manifestations: Secondary | ICD-10-CM

## 2016-11-15 DIAGNOSIS — R05 Cough: Secondary | ICD-10-CM | POA: Diagnosis not present

## 2016-11-15 DIAGNOSIS — R0981 Nasal congestion: Secondary | ICD-10-CM | POA: Diagnosis not present

## 2016-11-15 DIAGNOSIS — R059 Cough, unspecified: Secondary | ICD-10-CM

## 2016-11-15 LAB — POC INFLUENZA A&B (BINAX/QUICKVUE)
Influenza A, POC: POSITIVE — AB
Influenza B, POC: NEGATIVE

## 2016-11-15 MED ORDER — FLUTICASONE PROPIONATE 50 MCG/ACT NA SUSP: 2.0000 | Freq: Every day | NASAL | 6 refills | Status: AC

## 2016-11-15 MED ORDER — HYDROCODONE-HOMATROPINE 5-1.5 MG/5ML PO SYRP: 5.0000 mL | ORAL_SOLUTION | Freq: Three times a day (TID) | ORAL | 0 refills | Status: AC | PRN

## 2016-11-15 MED ORDER — BENZONATATE 100 MG PO CAPS: 100.0000 mg | ORAL_CAPSULE | Freq: Three times a day (TID) | ORAL | 0 refills | Status: AC | PRN

## 2016-11-15 NOTE — Patient Instructions (Addendum)
Push fluids - drink warm tea with honey to help sore throat.   Thank you for coming in today. I hope you feel we met your needs.  Feel free to call [facility] if you have any questions or further requests.  Please consider signing up for MyChart if you do not already have it, as this is a great way to communicate with me.  Best,  Whitney McVey, PA-C   Influenza, Adult Influenza, more commonly known as "the flu," is a viral infection that primarily affects the respiratory tract. The respiratory tract includes organs that help you breathe, such as the lungs, nose, and throat. The flu causes many common cold symptoms, as well as a high fever and body aches. The flu spreads easily from person to person (is contagious). Getting a flu shot (influenza vaccination) every year is the best way to prevent influenza. What are the causes? Influenza is caused by a virus. You can catch the virus by:  Breathing in droplets from an infected person's cough or sneeze.  Touching something that was recently contaminated with the virus and then touching your mouth, nose, or eyes. What increases the risk? The following factors may make you more likely to get the flu:  Not cleaning your hands frequently with soap and water or alcohol-based hand sanitizer.  Having close contact with many people during cold and flu season.  Touching your mouth, eyes, or nose without washing or sanitizing your hands first.  Not drinking enough fluids or not eating a healthy diet.  Not getting enough sleep or exercise.  Being under a high amount of stress.  Not getting a yearly (annual) flu shot. You may be at a higher risk of complications from the flu, such as a severe lung infection (pneumonia), if you:  Are over the age of 72.  Are pregnant.  Have a weakened disease-fighting system (immune system). You may have a weakened immune system if you:  Have HIV or AIDS.  Are undergoing chemotherapy.  Aretaking medicines  that reduce the activity of (suppress) the immune system.  Have a long-term (chronic) illness, such as heart disease, kidney disease, diabetes, or lung disease.  Have a liver disorder.  Are obese.  Have anemia. What are the signs or symptoms? Symptoms of this condition typically last 4-10 days and may include:  Fever.  Chills.  Headache, body aches, or muscle aches.  Sore throat.  Cough.  Runny or congested nose.  Chest discomfort and cough.  Poor appetite.  Weakness or tiredness (fatigue).  Dizziness.  Nausea or vomiting. How is this diagnosed? This condition may be diagnosed based on your medical history and a physical exam. Your health care provider may do a nose or throat swab test to confirm the diagnosis. How is this treated? If influenza is detected early, you can be treated with antiviral medicine that can reduce the length of your illness and the severity of your symptoms. This medicine may be given by mouth (orally) or through an IV tube that is inserted in one of your veins. The goal of treatment is to relieve symptoms by taking care of yourself at home. This may include taking over-the-counter medicines, drinking plenty of fluids, and adding humidity to the air in your home. In some cases, influenza goes away on its own. Severe influenza or complications from influenza may be treated in a hospital. Follow these instructions at home:  Take over-the-counter and prescription medicines only as told by your health care provider.  Use  a cool mist humidifier to add humidity to the air in your home. This can make breathing easier.  Rest as needed.  Drink enough fluid to keep your urine clear or pale yellow.  Cover your mouth and nose when you cough or sneeze.  Wash your hands with soap and water often, especially after you cough or sneeze. If soap and water are not available, use hand sanitizer.  Stay home from work or school as told by your health care  provider. Unless you are visiting your health care provider, try to avoid leaving home until your fever has been gone for 24 hours without the use of medicine.  Keep all follow-up visits as told by your health care provider. This is important. How is this prevented?  Getting an annual flu shot is the best way to avoid getting the flu. You may get the flu shot in late summer, fall, or winter. Ask your health care provider when you should get your flu shot.  Wash your hands often or use hand sanitizer often.  Avoid contact with people who are sick during cold and flu season.  Eat a healthy diet, drink plenty of fluids, get enough sleep, and exercise regularly. Contact a health care provider if:  You develop new symptoms.  You have:  Chest pain.  Diarrhea.  A fever.  Your cough gets worse.  You produce more mucus.  You feel nauseous or you vomit. Get help right away if:  You develop shortness of breath or difficulty breathing.  Your skin or nails turn a bluish color.  You have severe pain or stiffness in your neck.  You develop a sudden headache or sudden pain in your face or ear.  You cannot stop vomiting. This information is not intended to replace advice given to you by your health care provider. Make sure you discuss any questions you have with your health care provider. Document Released: 07/09/2000 Document Revised: 12/18/2015 Document Reviewed: 05/06/2015 Elsevier Interactive Patient Education  2017 [employer].  IF you received an x-ray today, you will receive an invoice from [facility]. Please contact [facility] at [phone] with questions or concerns regarding your invoice.   IF you received labwork today, you will receive an invoice from [place]. Please contact [facility] at [phone] with questions or concerns regarding your invoice.   Our billing staff will not be able to assist you with questions regarding bills from these  companies.  You will be contacted with the lab results as soon as they are available. The fastest way to get your results is to activate your My Chart account. Instructions are located on the last page of this paperwork. If you have not heard from [place] regarding the results in 2 weeks, please contact this office.

## 2016-11-15 NOTE — Progress Notes (Signed)
DIANNE BADY  MRN: [id number] DOB: 1991-06-13  PCP: No PCP Per Patient  Subjective:  Pt is a 26 year old male who presents to clinic for cough, headache, chills, fever, and fatigue x 4 days. Fever at home was 101.  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  Did not receive flu shot this year.  He has taken Tylenol.  Denies chest pain, chest tightness, n/v/d, abdominal pain. No known sick contacts.   Review of Systems  Constitutional: Positive for chills and fatigue. Negative for diaphoresis.  HENT: Positive for congestion and rhinorrhea. Negative for sore throat.   Respiratory: Positive for cough and shortness of breath. Negative for chest tightness and wheezing.   Cardiovascular: Negative for chest pain and palpitations.  Gastrointestinal: Negative for diarrhea, nausea and vomiting.  Neurological: Negative for dizziness, syncope, light-headedness and headaches.  Psychiatric/Behavioral: Positive for sleep disturbance.    There are no active problems to display for this patient.   Current Outpatient Prescriptions on File Prior to Visit  Medication Sig Dispense Refill  . acetaminophen (TYLENOL) 325 MG tablet Take 650 mg by mouth every 6 (six) hours as needed for mild pain.    . cyclobenzaprine (FLEXERIL) 5 MG tablet Take 1-2 tablets (5-10 mg total) by mouth 3 (three) times daily as needed for muscle spasms. (Patient not taking: Reported on 11/15/2016) 30 tablet 0  . meloxicam (MOBIC) 7.5 MG tablet Take 1 tablet (7.5 mg total) by mouth daily. (Patient not taking: Reported on 11/15/2016) 14 tablet 0  . promethazine (PHENERGAN) 25 MG tablet Take 1 tablet (25 mg total) by mouth every 6 (six) hours as needed for nausea or vomiting. (Patient not taking: Reported on 01/08/2015) 10 tablet 0   No current facility-administered medications on file prior to visit.     No Known Allergies   Objective:  BP 128/79 (BP Location: Right  Arm, Patient Position: Sitting, Cuff Size: Large)   Pulse 70   Temp 98.4 F (36.9 C) (Oral)   Resp 16   Ht  (1.956 m)   Wt 281 lb 9.6 oz (127.7 kg)   SpO2 96%   BMI 33.39 kg/m   Physical Exam  Constitutional: He is oriented to person, place, and time and well-developed, well-nourished, and in no distress. He appears not dehydrated.  Non-toxic appearance. He does not have a sickly appearance. No distress.  HENT:  Mouth/Throat: Oropharynx is clear and moist and mucous membranes are normal.  Cardiovascular: Normal rate, regular rhythm and normal heart sounds.   Pulmonary/Chest: Effort normal and breath sounds normal. He has no wheezes.  Lymphadenopathy:       Head (right side): Posterior auricular adenopathy present. No preauricular adenopathy present.       Head (left side): Posterior auricular adenopathy present. No preauricular adenopathy present.  Neurological: He is alert and oriented to person, place, and time. GCS score is 15.  Skin: Skin is warm. He is diaphoretic.  Psychiatric: Mood, memory, affect and judgment normal.  Vitals reviewed.  Results for orders placed or performed in visit on 11/15/16  POC Influenza A&B(BINAX/QUICKVUE)  Result Value Ref Range   Influenza A, POC Positive (A) Negative   Influenza B, POC Negative Negative    Assessment and Plan :  1. Influenza A 2. Fever, unspecified fever cause 3. Cough 4. Nasal congestion - POC Influenza A&B(BINAX/QUICKVUE) - HYDROcodone-homatropine (HYCODAN) 5-1.5 MG/5ML syrup; Take 5 mLs by mouth every 8 (  eight) hours as needed for cough.  Dispense: 120 mL; Refill: 0 - benzonatate (TESSALON) 100 MG capsule; Take 1-2 capsules (100-200 mg total) by mouth 3 (three) times daily as needed for cough.  Dispense: 40 capsule; Refill: 0 - fluticasone (FLONASE) 50 MCG/ACT nasal spray; Place 2 sprays into both nostrils daily.  Dispense: 16 g; Refill: 6 - Pt is >48 hours from symptom onset. Treat supportively: push fluids, rest.  RTC in 5-7 days if no improvement. He agrees with plan.   Marco Collie, PA-C  [facility] 11/15/2016 5:26 PM

## 2016-12-15 IMAGING — DX DG CHEST 2V
2 series · 2 of 2 positions shown · non-contrast
Comparison: 04/01/2014

CLINICAL DATA: Dizziness.  Food poisoning over the weekend.

EXAM:
CHEST  2 VIEW

[chest pa]
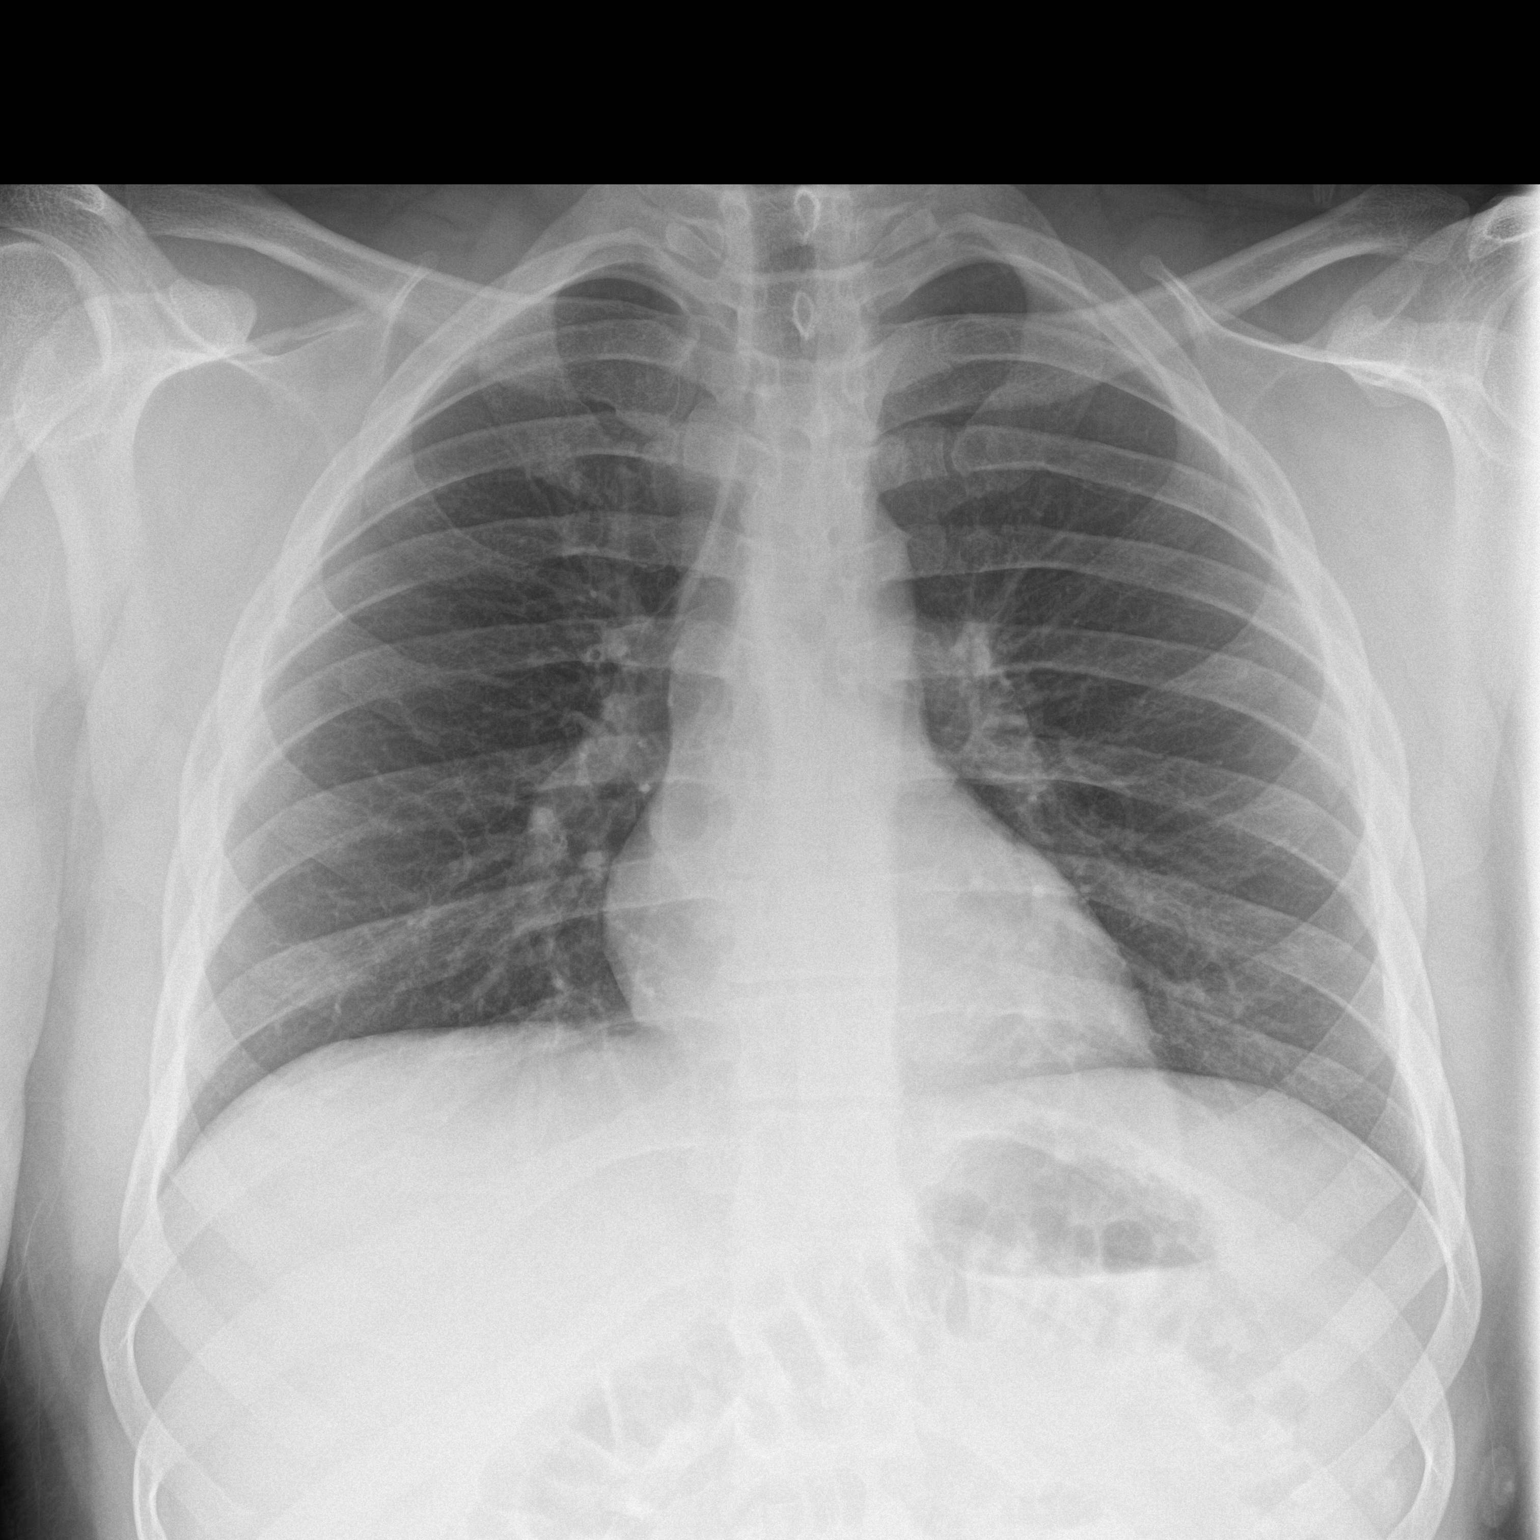

[chest lat]
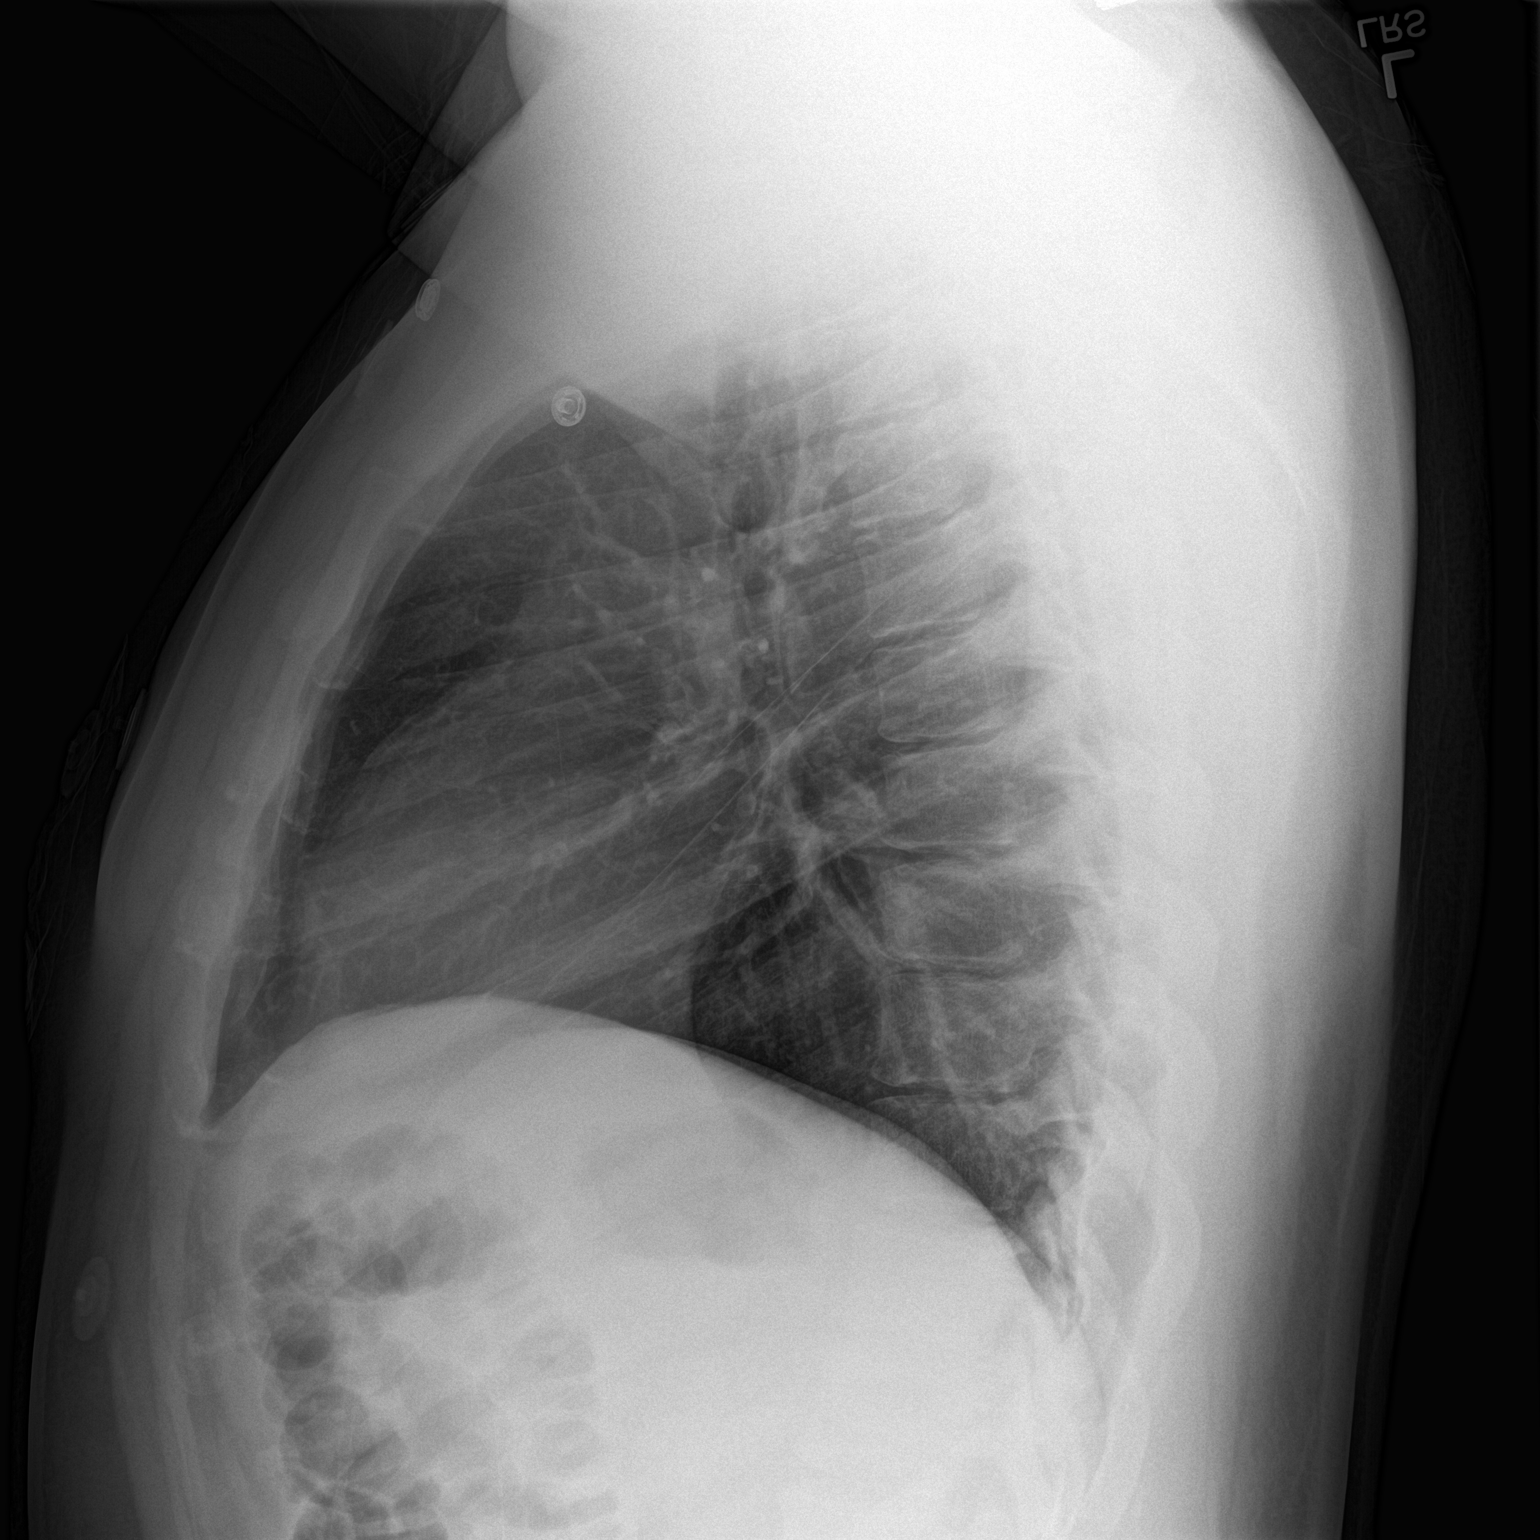

[2 of 2 positions shown; findings below may reference images not displayed]

FINDINGS: The heart size and mediastinal contours are within normal limits.
Both lungs are clear. The visualized skeletal structures are
unremarkable.
IMPRESSION: No active cardiopulmonary disease.

## 2017-08-25 ENCOUNTER — Emergency Department (HOSPITAL_COMMUNITY)
Admission: EM | Admit: 2017-08-25 | Discharge: 2017-08-25 | Disposition: A | Discharge status: Home / Self Care | Payer: Managed Care, Other (non HMO) | Attending: Emergency Medicine | Admitting: Emergency Medicine

## 2017-08-25 ENCOUNTER — Other Ambulatory Visit: Payer: Self-pay

## 2017-08-25 ENCOUNTER — Encounter (HOSPITAL_COMMUNITY): Payer: Self-pay

## 2017-08-25 DIAGNOSIS — J111 Influenza due to unidentified influenza virus with other respiratory manifestations: Secondary | ICD-10-CM | POA: Diagnosis not present

## 2017-08-25 DIAGNOSIS — R69 Illness, unspecified: Secondary | ICD-10-CM

## 2017-08-25 DIAGNOSIS — Z79899 Other long term (current) drug therapy: Secondary | ICD-10-CM | POA: Diagnosis not present

## 2017-08-25 DIAGNOSIS — R05 Cough: Secondary | ICD-10-CM | POA: Diagnosis present

## 2017-08-25 MED ORDER — IBUPROFEN 800 MG PO TABS
800.0000 mg | ORAL_TABLET | Freq: Once | ORAL | Status: DC
  Filled 2017-08-25: qty 1

## 2017-08-25 MED ORDER — SODIUM CHLORIDE 0.9 % IV BOLUS (SEPSIS)
2000.0000 mL | Freq: Once | INTRAVENOUS | Status: AC
Start: 2017-08-25 — End: 2017-08-25
  Administered 2017-08-25: 2000 mL via INTRAVENOUS

## 2017-08-25 MED ORDER — OSELTAMIVIR PHOSPHATE 75 MG PO CAPS: 75.0000 mg | ORAL_CAPSULE | Freq: Two times a day (BID) | ORAL | 0 refills | Status: AC

## 2017-08-25 MED ORDER — HYDROCOD POLST-CPM POLST ER 10-8 MG/5ML PO SUER
5.0000 mL | Freq: Two times a day (BID) | ORAL | 0 refills | Status: AC | PRN
Start: 2017-08-25 — End: ?

## 2017-08-25 MED ORDER — IBUPROFEN 800 MG PO TABS
800.0000 mg | ORAL_TABLET | Freq: Once | ORAL | Status: AC
  Administered 2017-08-25: 800 mg via ORAL
  Filled 2017-08-25: qty 1

## 2017-08-25 MED ORDER — OSELTAMIVIR PHOSPHATE 75 MG PO CAPS
75.0000 mg | ORAL_CAPSULE | Freq: Once | ORAL | Status: AC
  Administered 2017-08-25: 75 mg via ORAL
  Filled 2017-08-25: qty 1

## 2017-08-25 MED ORDER — ACETAMINOPHEN 500 MG PO TABS
1000.0000 mg | ORAL_TABLET | Freq: Once | ORAL | Status: AC
  Administered 2017-08-25: 1000 mg via ORAL
  Filled 2017-08-25: qty 2

## 2017-08-25 NOTE — ED Provider Notes (Signed)
  Physical Exam  BP (!) 115/53   Pulse 93   Temp (!) 101.8 F (38.8 C) (Oral)   Resp 18   SpO2 96%   Physical Exam  Constitutional: He appears well-developed and well-nourished. No distress.  HENT:  Head: Normocephalic and atraumatic.  Eyes: Conjunctivae and EOM are normal. No scleral icterus.  Neck: Normal range of motion.  Cardiovascular: Normal rate, regular rhythm and normal heart sounds.  Pulmonary/Chest: Effort normal and breath sounds normal. No respiratory distress.  Neurological: He is alert.  Skin: No rash noted. He is not diaphoretic.  Psychiatric: He has a normal mood and affect.  Nursing note and vitals reviewed.   ED Course/Procedures     Procedures  MDM  Care handed off from previous provider, Upstill, PA-C. Briefly, patient presents to ED for evaluation of 24-hour history of influenza-like illness including cough, congestion, body aches and fever.  His fever with T-max 101.8 here in the ED.  This improved with antipyretics.  However, his heart rate remained in 110s-115's even after defervescence.  Patient given 2 L fluid with much improvement in his symptoms and improvement in his heart rate.  Patient given first dose of Tamiflu here and will be given prescription for this as well as cough medication.  Advised to follow-up with PCP for further evaluation. Strict return precautions given.  Portions of this note were generated with Scientist, clinical (histocompatibility and immunogenetics)Dragon dictation software. Dictation errors may occur despite best attempts at proofreading.     Dietrich PatesKhatri, Yanissa Michalsky, PA-C 08/25/17 [id number]    Pricilla LovelessGoldston, Scott, MD 08/26/17 [phone]

## 2017-08-25 NOTE — ED Notes (Signed)
PA at bedside.

## 2017-08-25 NOTE — ED Provider Notes (Signed)
[facility] EMERGENCY DEPARTMENT Provider Note   CSN: [id number] Arrival date & time: 08/25/17  0340     History   Chief Complaint Chief Complaint  Patient presents with  . Influenza    HPI Tony Oneill is a 27 y.o. male.  Patient without significant medical history presents with 24 hours of cough, congestion, body aches and fever. No nausea or vomiting. He feels lightheaded but has no syncope. No sick contacts. He is eating and drinking per his usual and has normal urinary output. No diarrhea.   The history is provided by the patient. No language interpreter was used.  Influenza  Presenting symptoms: cough, fever and myalgias   Presenting symptoms: no nausea, no shortness of breath, no sore throat and no vomiting   Associated symptoms: chills and nasal congestion     History reviewed. No pertinent past medical history.  There are no active problems to display for this patient.   History reviewed. No pertinent surgical history.     Home Medications    Prior to Admission medications   Medication Sig Start Date End Date Taking? Authorizing Provider  acetaminophen (TYLENOL) 325 MG tablet Take 650 mg by mouth every 6 (six) hours as needed for mild pain.    [provider]  benzonatate (TESSALON) 100 MG capsule Take 1-2 capsules (100-200 mg total) by mouth 3 (three) times daily as needed for cough. 11/15/16   McVey, Madelaine Bhat, PA-C  cyclobenzaprine (FLEXERIL) 5 MG tablet Take 1-2 tablets (5-10 mg total) by mouth 3 (three) times daily as needed for muscle spasms. Patient not taking: Reported on 11/15/2016 11/10/15   Trena Platt D, PA  fluticasone [facility]) 50 MCG/ACT nasal spray Place 2 sprays into both nostrils daily. 11/15/16   McVey, Madelaine Bhat, PA-C  HYDROcodone-homatropine (HYCODAN) 5-1.5 MG/5ML syrup Take 5 mLs by mouth every 8 (eight) hours as needed for cough. 11/15/16   McVey, Madelaine Bhat, PA-C  meloxicam (MOBIC)  7.5 MG tablet Take 1 tablet (7.5 mg total) by mouth daily. Patient not taking: Reported on 11/15/2016 11/10/15   Trena Platt D, PA  promethazine (PHENERGAN) 25 MG tablet Take 1 tablet (25 mg total) by mouth every 6 (six) hours as needed for nausea or vomiting. Patient not taking: Reported on 01/08/2015 12/30/14   Rolla Plate    Family History Family History  Problem Relation Age of Onset  . Heart disease Mother     Social History Social History   Tobacco Use  . Smoking status: Never Smoker  . Smokeless tobacco: Never Used  Substance Use Topics  . Alcohol use: No  . Drug use: No     Allergies   Patient has no known allergies.   Review of Systems Review of Systems  Constitutional: Positive for chills and fever.  HENT: Positive for congestion. Negative for sore throat.   Respiratory: Positive for cough. Negative for shortness of breath.   Cardiovascular: Negative.  Negative for chest pain.  Gastrointestinal: Negative.  Negative for abdominal pain, nausea and vomiting.  Genitourinary: Negative.  Negative for decreased urine volume.  Musculoskeletal: Positive for myalgias.  Skin: Negative.   Neurological: Positive for weakness and light-headedness. Negative for syncope.     Physical Exam Updated Vital Signs BP (!) 155/78 (BP Location: Right Arm)   Pulse (!) 108   Temp (!) 103.2 F (39.6 C) (Oral)   Resp 18   SpO2 100%   Physical Exam  Constitutional: He is oriented to person,  place, and time. He appears well-developed and well-nourished.  HENT:  Head: Normocephalic.  Neck: Normal range of motion. Neck supple.  Cardiovascular: Regular rhythm. Tachycardia present.  No murmur heard. Pulmonary/Chest: Effort normal and breath sounds normal. He has no wheezes. He has no rales.  Abdominal: Soft. Bowel sounds are normal. There is no tenderness. There is no rebound and no guarding.  Musculoskeletal: Normal range of motion.  Neurological: He is alert and  oriented to person, place, and time.  Skin: Skin is warm and dry. No rash noted.  Psychiatric: He has a normal mood and affect.     ED Treatments / Results  Labs (all labs ordered are listed, but only abnormal results are displayed) Labs Reviewed - No data to display  EKG  EKG Interpretation None       Radiology No results found.  Procedures Procedures (including critical care time)  Medications Ordered in ED Medications  acetaminophen (TYLENOL) tablet 1,000 mg (1,000 mg Oral Given 08/25/17 0420)     Initial Impression / Assessment and Plan / ED Course  I have reviewed the triage vital signs and the nursing notes.  Pertinent labs & imaging results that were available during my care of the patient were reviewed by me and considered in my medical decision making (see chart for details).     Patient presents with flu-like symptoms x 24 hours. He is febrile and tachycardic but drinking easily and urinating. He is otherwise healthy and does not smoke.   Tylenol given with some reduction in fever. Ibuprofen added. He remains persistently tachycardic with stubborn fever. Will provide 2 liters of fluid and reassess. Discussed Tamiflu - benefits and side effects - and the patient prefers to have a prescription.  [facility], for recheck and anticipated discharge home. Patient updated and agrees to plan.  Final Clinical Impressions(s) / ED Diagnoses   Final diagnoses:  None   1. Influenza like illness  ED Discharge Orders    None       Elpidio AnisUpstill, Tahirih Lair, PA-C 08/25/17 [id number]    Zadie RhineWickline, Donald, MD 08/25/17 [phone]

## 2017-08-25 NOTE — ED Triage Notes (Signed)
Pt states flu like symptoms since this AM, cough, fevers, body aches, headache, dizziness. Pt requesting flu test

## 2019-02-07 ENCOUNTER — Other Ambulatory Visit: Payer: Self-pay

## 2019-02-07 DIAGNOSIS — Z20822 Contact with and (suspected) exposure to covid-19: Secondary | ICD-10-CM

## 2019-02-12 LAB — NOVEL CORONAVIRUS, NAA: SARS-CoV-2, NAA: NOT DETECTED

## 2021-02-03 ENCOUNTER — Emergency Department (HOSPITAL_BASED_OUTPATIENT_CLINIC_OR_DEPARTMENT_OTHER)
Admission: EM | Admit: 2021-02-03 | Discharge: 2021-02-03 | Disposition: A | Discharge status: Home / Self Care | Payer: Managed Care, Other (non HMO) | Attending: Emergency Medicine | Admitting: Emergency Medicine

## 2021-02-03 ENCOUNTER — Other Ambulatory Visit: Payer: Self-pay

## 2021-02-03 ENCOUNTER — Encounter (HOSPITAL_BASED_OUTPATIENT_CLINIC_OR_DEPARTMENT_OTHER): Payer: Self-pay | Admitting: Emergency Medicine

## 2021-02-03 DIAGNOSIS — E119 Type 2 diabetes mellitus without complications: Secondary | ICD-10-CM | POA: Insufficient documentation

## 2021-02-03 DIAGNOSIS — M7989 Other specified soft tissue disorders: Secondary | ICD-10-CM | POA: Diagnosis not present

## 2021-02-03 DIAGNOSIS — Z79899 Other long term (current) drug therapy: Secondary | ICD-10-CM | POA: Insufficient documentation

## 2021-02-03 DIAGNOSIS — L539 Erythematous condition, unspecified: Secondary | ICD-10-CM | POA: Insufficient documentation

## 2021-02-03 DIAGNOSIS — Z7984 Long term (current) use of oral hypoglycemic drugs: Secondary | ICD-10-CM | POA: Diagnosis not present

## 2021-02-03 DIAGNOSIS — I10 Essential (primary) hypertension: Secondary | ICD-10-CM | POA: Insufficient documentation

## 2021-02-03 DIAGNOSIS — Z23 Encounter for immunization: Secondary | ICD-10-CM | POA: Diagnosis not present

## 2021-02-03 DIAGNOSIS — L03011 Cellulitis of right finger: Secondary | ICD-10-CM

## 2021-02-03 HISTORY — DX: Type 2 diabetes mellitus without complications: E11.9

## 2021-02-03 HISTORY — DX: Essential (primary) hypertension: I10

## 2021-02-03 MED ORDER — TETANUS-DIPHTH-ACELL PERTUSSIS 5-2.5-18.5 LF-MCG/0.5 IM SUSY
0.5000 mL | PREFILLED_SYRINGE | Freq: Once | INTRAMUSCULAR | Status: AC
  Administered 2021-02-03: 0.5 mL via INTRAMUSCULAR
  Filled 2021-02-03: qty 0.5

## 2021-02-03 NOTE — ED Provider Notes (Signed)
[facility] EMERGENCY DEPT Provider Note   CSN: [id number] Arrival date & time: 02/03/21  1829     History Chief Complaint  Patient presents with   Hand Pain    Tony Oneill is a 30 y.o. male.  30 year old male who complains to drainage from the side of his right third finger.  [place] Spaniel he has some swelling there and was able to express some pus out of there.  Denies any fever or chills.  No distal numbness or tingling.  Thinks he may have had a hangnail.      Past Medical History:  Diagnosis Date   Diabetes mellitus without complication (HCC)    Hypertension     There are no problems to display for this patient.   History reviewed. No pertinent surgical history.     Family History  Problem Relation Age of Onset   Heart disease Mother     Social History   Tobacco Use   Smoking status: Never   Smokeless tobacco: Never  Vaping Use   Vaping Use: Never used  Substance Use Topics   Alcohol use: Yes    Comment: on weekends   Drug use: No    Home Medications Prior to Admission medications   Medication Sig Start Date End Date Taking? Authorizing Provider  glipizide-metformin (METAGLIP) 2.5-250 MG tablet Take 1 tablet by mouth 2 (two) times daily before a meal.   Yes [provider]  lisinopril (ZESTRIL) 10 MG tablet Take 10 mg by mouth daily.   Yes [provider]  metFORMIN (GLUCOPHAGE) 1000 MG tablet Take 1,000 mg by mouth 2 (two) times daily with a meal.   Yes [provider]  acetaminophen (TYLENOL) 325 MG tablet Take 650 mg by mouth every 6 (six) hours as needed for mild pain.    [provider]  benzonatate (TESSALON) 100 MG capsule Take 1-2 capsules (100-200 mg total) by mouth 3 (three) times daily as needed for cough. 11/15/16   McVey, Madelaine Bhat, PA-C  chlorpheniramine-HYDROcodone (TUSSIONEX PENNKINETIC ER) 10-8 MG/5ML SUER Take 5 mLs by mouth every 12 (twelve) hours as needed for cough. 08/25/17    Elpidio Anis, PA-C  cyclobenzaprine (FLEXERIL) 5 MG tablet Take 1-2 tablets (5-10 mg total) by mouth 3 (three) times daily as needed for muscle spasms. Patient not taking: No sig reported 11/10/15   Trena Platt D, PA  fluticasone (FLONASE) 50 MCG/ACT nasal spray Place 2 sprays into both nostrils daily. 11/15/16   McVey, Madelaine Bhat, PA-C  HYDROcodone-homatropine (HYCODAN) 5-1.5 MG/5ML syrup Take 5 mLs by mouth every 8 (eight) hours as needed for cough. 11/15/16   McVey, Madelaine Bhat, PA-C  meloxicam (MOBIC) 7.5 MG tablet Take 1 tablet (7.5 mg total) by mouth daily. Patient not taking: No sig reported 11/10/15   Trena Platt D, PA  oseltamivir (TAMIFLU) 75 MG capsule Take 1 capsule (75 mg total) by mouth every 12 (twelve) hours. 08/25/17   Elpidio Anis, PA-C  promethazine (PHENERGAN) 25 MG tablet Take 1 tablet (25 mg total) by mouth every 6 (six) hours as needed for nausea or vomiting. Patient not taking: No sig reported 12/30/14   Lurene Shadow, PA-C    Allergies    Patient has no known allergies.  Review of Systems   Review of Systems  All other systems reviewed and are negative.  Physical Exam Updated Vital Signs BP (!) 154/90 (BP Location: Left Arm)   Pulse 74   Temp 98.3 F (36.8 C) (Oral)  Resp 20   Ht 1.981 m (6\' 6" )   Wt (!) 140.6 kg   SpO2 99%   BMI 35.82 kg/m   Physical Exam Vitals and nursing note reviewed.  Constitutional:      Appearance: He is well-developed. He is not toxic-appearing.  HENT:     Head: Normocephalic and atraumatic.  Eyes:     Conjunctiva/sclera: Conjunctivae normal.     Pupils: Pupils are equal, round, and reactive to light.  Cardiovascular:     Rate and Rhythm: Normal rate.  Pulmonary:     Effort: Pulmonary effort is normal.  Musculoskeletal:     Cervical back: Normal range of motion.     Comments: Edema slight erythema noted to lateral nail fold of right third digit.  No abscess appreciated.  No lymphangitic spread  down the patient's finger.  Skin:    General: Skin is warm and dry.  Neurological:     Mental Status: He is alert and oriented to person, place, and time.    ED Results / Procedures / Treatments   Labs (all labs ordered are listed, but only abnormal results are displayed) Labs Reviewed - No data to display  EKG None  Radiology No results found.  Procedures Procedures   Medications Ordered in ED Medications  Tdap (BOOSTRIX) injection 0.5 mL (has no administration in time range)    ED Course  I have reviewed the triage vital signs and the nursing notes.  Pertinent labs & imaging results that were available during my care of the patient were reviewed by me and considered in my medical decision making (see chart for details).    MDM Rules/Calculators/A&P                          Patient's tetanus updated here.  Will place on doxycycline.  No drainable area at this time. Final Clinical Impression(s) / ED Diagnoses Final diagnoses:  None    Rx / DC Orders ED Discharge Orders     None        , MD 02/03/21 1904

## 2021-02-03 NOTE — ED Triage Notes (Signed)
Pt via pov from home with red, painful 3rd finger of right hand. Pt states he doesn't remember any specific injury, but states that he "may have had a hang nail" and he wears gloves at work, so he thinks it may have been pulled out by the gloves. Pt alert & oriented, nad noted.

## 2021-02-03 NOTE — Discharge Instructions (Addendum)
Apply Neosporin to the area once a day for the next 5 days
